# Patient Record
Sex: Female | Born: 1945 | Race: White | Hispanic: No
Health system: Southern US, Community
[De-identification: ages and names within clinical notes are randomized; demographics above are authoritative.]

---

## 2009-01-21 ENCOUNTER — Encounter: Admission: RE | Admit: 2009-01-21 | Discharge: 2009-01-21 | Payer: Self-pay | Admitting: Unknown Physician Specialty

## 2009-06-24 ENCOUNTER — Encounter: Admission: RE | Admit: 2009-06-24 | Discharge: 2009-06-24 | Payer: Self-pay | Admitting: Unknown Physician Specialty

## 2009-07-24 ENCOUNTER — Encounter: Admission: RE | Admit: 2009-07-24 | Discharge: 2009-07-24 | Payer: Self-pay | Admitting: Unknown Physician Specialty

## 2010-09-08 ENCOUNTER — Encounter: Payer: Self-pay | Admitting: Unknown Physician Specialty

## 2010-11-18 ENCOUNTER — Ambulatory Visit
Admission: RE | Admit: 2010-11-18 | Discharge: 2010-11-18 | Disposition: A | Discharge status: Home / Self Care | Payer: Self-pay | Source: Ambulatory Visit | Attending: Unknown Physician Specialty | Admitting: Unknown Physician Specialty

## 2010-11-18 ENCOUNTER — Other Ambulatory Visit: Payer: Self-pay | Admitting: Unknown Physician Specialty

## 2010-11-18 DIAGNOSIS — M25562 Pain in left knee: Secondary | ICD-10-CM

## 2010-12-29 ENCOUNTER — Ambulatory Visit
Admission: RE | Admit: 2010-12-29 | Discharge: 2010-12-29 | Disposition: A | Discharge status: Home / Self Care | Payer: BC Managed Care – PPO | Source: Ambulatory Visit | Attending: Unknown Physician Specialty | Admitting: Unknown Physician Specialty

## 2010-12-29 ENCOUNTER — Other Ambulatory Visit: Payer: Self-pay | Admitting: Unknown Physician Specialty

## 2010-12-29 DIAGNOSIS — IMO0001 Reserved for inherently not codable concepts without codable children: Secondary | ICD-10-CM

## 2010-12-29 DIAGNOSIS — R059 Cough, unspecified: Secondary | ICD-10-CM

## 2010-12-29 DIAGNOSIS — R05 Cough: Secondary | ICD-10-CM

## 2011-07-01 IMAGING — CR DG CHEST 2V
2 series · 2 of 2 positions shown · non-contrast
Comparison: None.

CLINICAL DATA: Cough and congestion.  Question pneumonia.

CHEST - 2 VIEW

[view not recorded (1 of 2)]
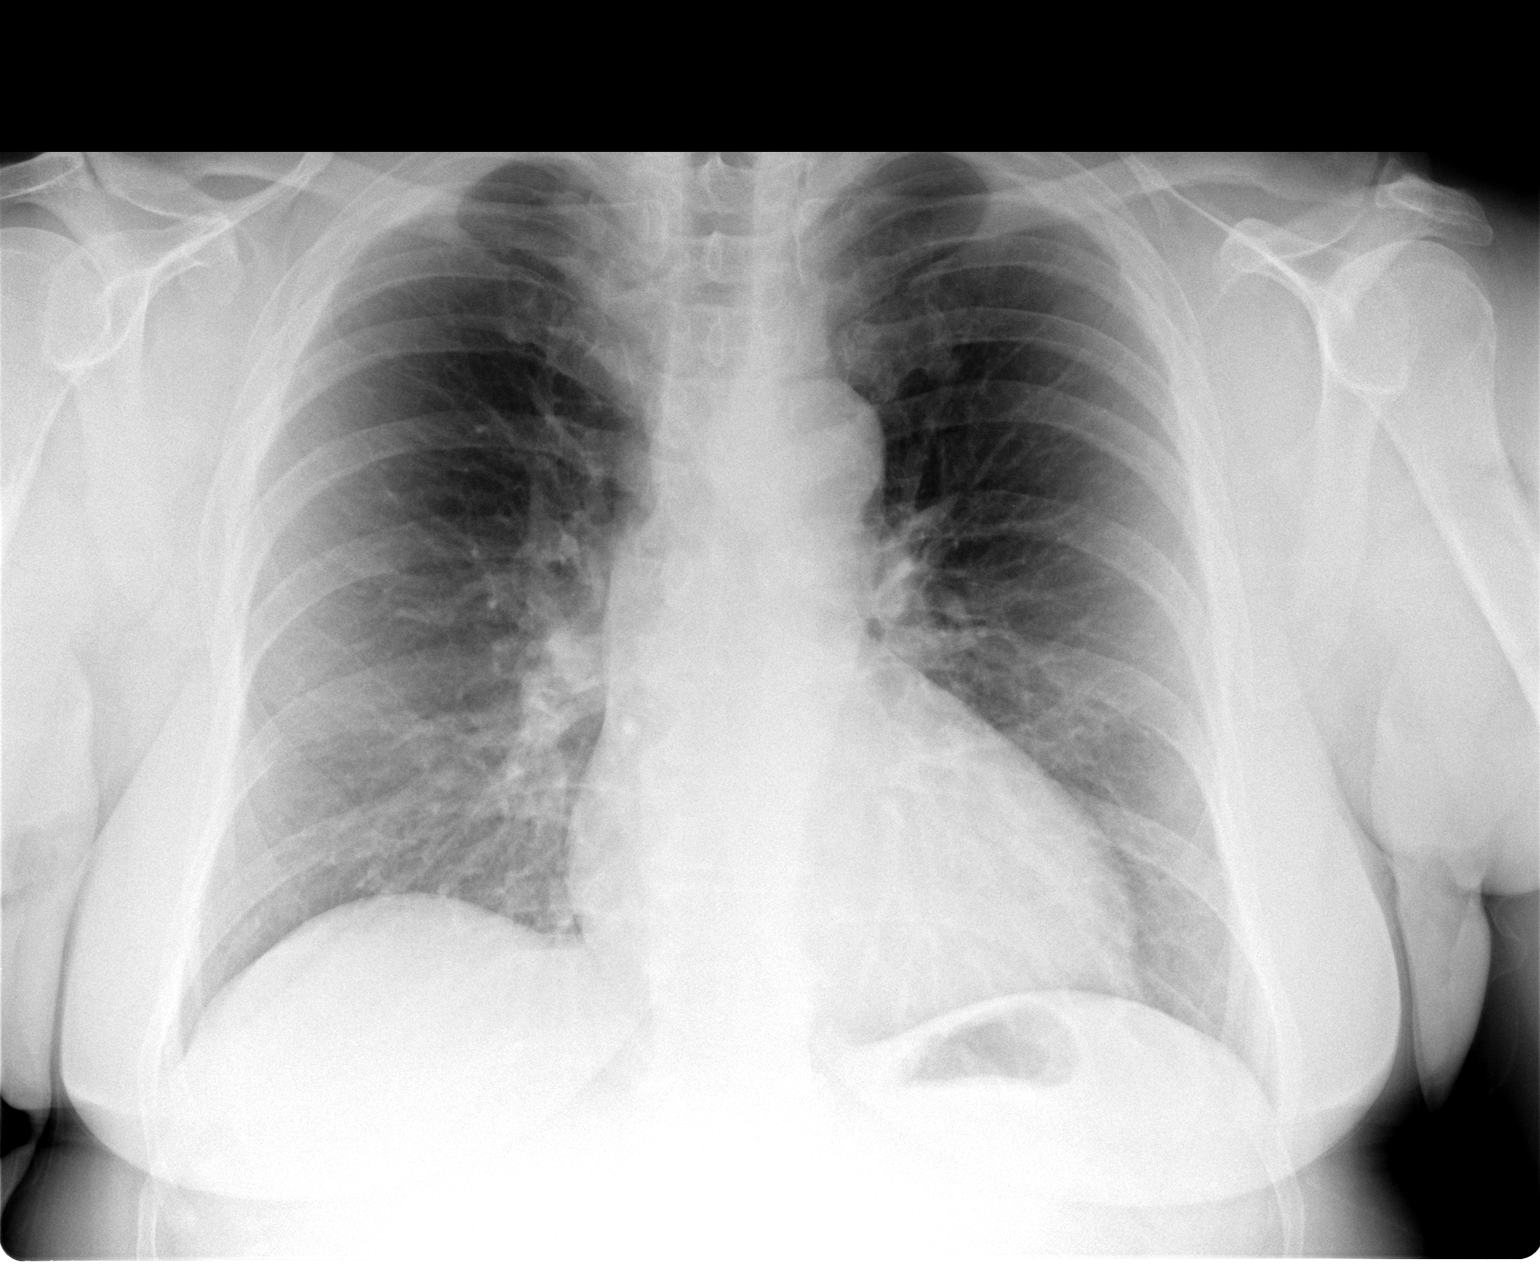

[view not recorded (2 of 2)]
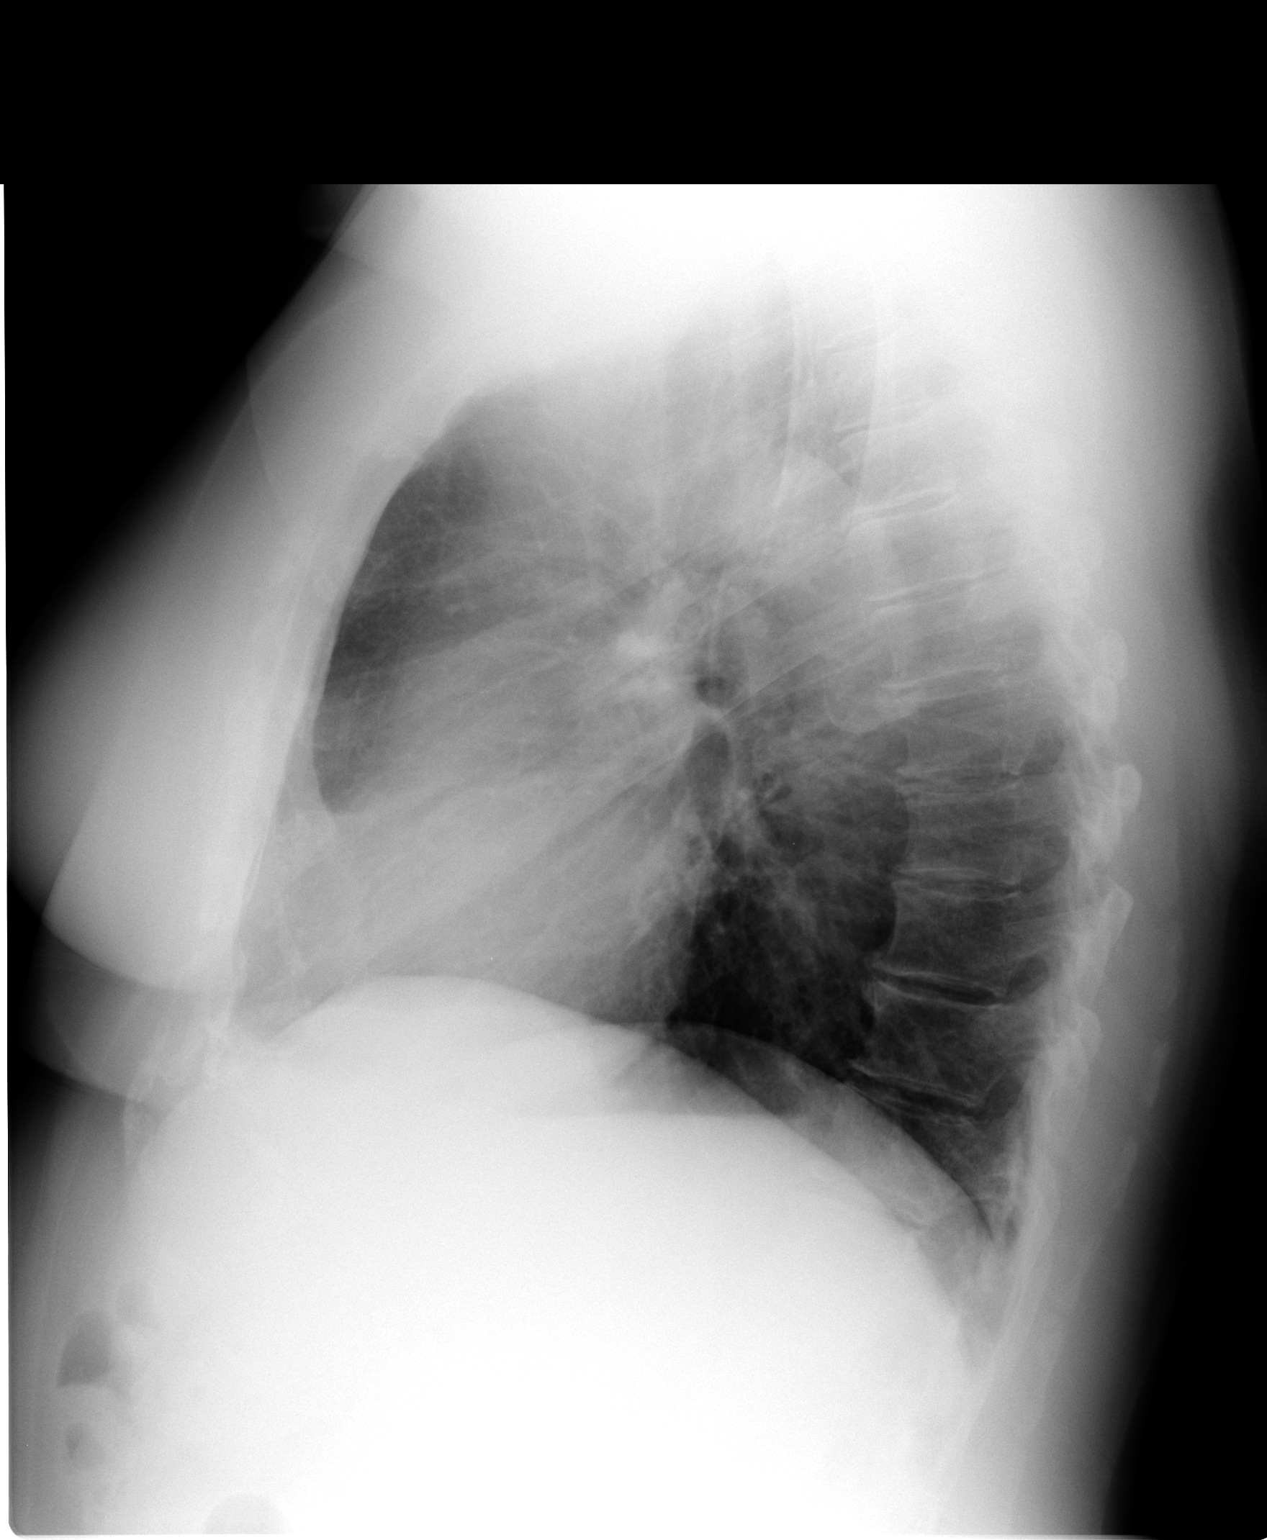

[2 of 2 positions shown; findings below may reference images not displayed]

FINDINGS: The heart, mediastinal, and hilar contours are normal.
Pulmonary vascularity is normal.  The lungs are clear.  No focal
airspace opacities identified to suggest pneumonia.  There is no
edema, effusion, or pneumothorax.  No acute bony abnormality.
IMPRESSION: No acute cardiopulmonary disease.

## 2011-10-13 ENCOUNTER — Ambulatory Visit
Admission: RE | Admit: 2011-10-13 | Discharge: 2011-10-13 | Disposition: A | Discharge status: Home / Self Care | Payer: BC Managed Care – PPO | Source: Ambulatory Visit | Attending: Unknown Physician Specialty | Admitting: Unknown Physician Specialty

## 2011-10-13 ENCOUNTER — Other Ambulatory Visit: Payer: Self-pay | Admitting: Unknown Physician Specialty

## 2014-01-15 ENCOUNTER — Other Ambulatory Visit: Payer: Self-pay | Admitting: Family Medicine

## 2014-01-15 ENCOUNTER — Ambulatory Visit (INDEPENDENT_AMBULATORY_CARE_PROVIDER_SITE_OTHER): Payer: BC Managed Care – PPO

## 2014-01-15 DIAGNOSIS — R059 Cough, unspecified: Secondary | ICD-10-CM

## 2014-01-15 DIAGNOSIS — R05 Cough: Secondary | ICD-10-CM

## 2014-01-15 DIAGNOSIS — J189 Pneumonia, unspecified organism: Secondary | ICD-10-CM

## 2014-01-15 DIAGNOSIS — R0989 Other specified symptoms and signs involving the circulatory and respiratory systems: Secondary | ICD-10-CM

## 2015-01-21 DIAGNOSIS — M545 Low back pain: Secondary | ICD-10-CM | POA: Diagnosis not present

## 2015-01-22 DIAGNOSIS — M47816 Spondylosis without myelopathy or radiculopathy, lumbar region: Secondary | ICD-10-CM | POA: Diagnosis not present

## 2015-01-22 DIAGNOSIS — M47817 Spondylosis without myelopathy or radiculopathy, lumbosacral region: Secondary | ICD-10-CM | POA: Diagnosis not present

## 2015-01-23 DIAGNOSIS — M545 Low back pain: Secondary | ICD-10-CM | POA: Diagnosis not present

## 2015-01-23 DIAGNOSIS — M4806 Spinal stenosis, lumbar region: Secondary | ICD-10-CM | POA: Diagnosis not present

## 2015-01-31 DIAGNOSIS — J3081 Allergic rhinitis due to animal (cat) (dog) hair and dander: Secondary | ICD-10-CM | POA: Diagnosis not present

## 2015-01-31 DIAGNOSIS — J3089 Other allergic rhinitis: Secondary | ICD-10-CM | POA: Diagnosis not present

## 2015-01-31 DIAGNOSIS — J301 Allergic rhinitis due to pollen: Secondary | ICD-10-CM | POA: Diagnosis not present

## 2015-02-20 DIAGNOSIS — J3089 Other allergic rhinitis: Secondary | ICD-10-CM | POA: Diagnosis not present

## 2015-02-20 DIAGNOSIS — J301 Allergic rhinitis due to pollen: Secondary | ICD-10-CM | POA: Diagnosis not present

## 2015-02-20 DIAGNOSIS — J3081 Allergic rhinitis due to animal (cat) (dog) hair and dander: Secondary | ICD-10-CM | POA: Diagnosis not present

## 2015-03-13 DIAGNOSIS — M1990 Unspecified osteoarthritis, unspecified site: Secondary | ICD-10-CM | POA: Diagnosis not present

## 2015-03-13 DIAGNOSIS — R739 Hyperglycemia, unspecified: Secondary | ICD-10-CM | POA: Diagnosis not present

## 2015-03-13 DIAGNOSIS — E782 Mixed hyperlipidemia: Secondary | ICD-10-CM | POA: Diagnosis not present

## 2015-03-13 DIAGNOSIS — I10 Essential (primary) hypertension: Secondary | ICD-10-CM | POA: Diagnosis not present

## 2015-03-14 DIAGNOSIS — J3081 Allergic rhinitis due to animal (cat) (dog) hair and dander: Secondary | ICD-10-CM | POA: Diagnosis not present

## 2015-03-14 DIAGNOSIS — Z91010 Allergy to peanuts: Secondary | ICD-10-CM | POA: Diagnosis not present

## 2015-03-14 DIAGNOSIS — J301 Allergic rhinitis due to pollen: Secondary | ICD-10-CM | POA: Diagnosis not present

## 2015-03-14 DIAGNOSIS — J3089 Other allergic rhinitis: Secondary | ICD-10-CM | POA: Diagnosis not present

## 2015-03-14 DIAGNOSIS — J4520 Mild intermittent asthma, uncomplicated: Secondary | ICD-10-CM | POA: Diagnosis not present

## 2015-04-08 DIAGNOSIS — N951 Menopausal and female climacteric states: Secondary | ICD-10-CM | POA: Diagnosis not present

## 2015-04-11 DIAGNOSIS — J3089 Other allergic rhinitis: Secondary | ICD-10-CM | POA: Diagnosis not present

## 2015-04-11 DIAGNOSIS — J3081 Allergic rhinitis due to animal (cat) (dog) hair and dander: Secondary | ICD-10-CM | POA: Diagnosis not present

## 2015-04-11 DIAGNOSIS — J301 Allergic rhinitis due to pollen: Secondary | ICD-10-CM | POA: Diagnosis not present

## 2015-04-30 DIAGNOSIS — H9202 Otalgia, left ear: Secondary | ICD-10-CM | POA: Diagnosis not present

## 2015-04-30 DIAGNOSIS — M542 Cervicalgia: Secondary | ICD-10-CM | POA: Diagnosis not present

## 2015-04-30 DIAGNOSIS — R200 Anesthesia of skin: Secondary | ICD-10-CM | POA: Diagnosis not present

## 2015-04-30 DIAGNOSIS — I10 Essential (primary) hypertension: Secondary | ICD-10-CM | POA: Diagnosis not present

## 2015-05-07 DIAGNOSIS — J3089 Other allergic rhinitis: Secondary | ICD-10-CM | POA: Diagnosis not present

## 2015-05-07 DIAGNOSIS — J301 Allergic rhinitis due to pollen: Secondary | ICD-10-CM | POA: Diagnosis not present

## 2015-05-07 DIAGNOSIS — J3081 Allergic rhinitis due to animal (cat) (dog) hair and dander: Secondary | ICD-10-CM | POA: Diagnosis not present

## 2015-06-05 DIAGNOSIS — J3089 Other allergic rhinitis: Secondary | ICD-10-CM | POA: Diagnosis not present

## 2015-06-05 DIAGNOSIS — J3081 Allergic rhinitis due to animal (cat) (dog) hair and dander: Secondary | ICD-10-CM | POA: Diagnosis not present

## 2015-06-05 DIAGNOSIS — J301 Allergic rhinitis due to pollen: Secondary | ICD-10-CM | POA: Diagnosis not present

## 2015-06-10 DIAGNOSIS — J301 Allergic rhinitis due to pollen: Secondary | ICD-10-CM | POA: Diagnosis not present

## 2015-06-10 DIAGNOSIS — J3081 Allergic rhinitis due to animal (cat) (dog) hair and dander: Secondary | ICD-10-CM | POA: Diagnosis not present

## 2015-06-10 DIAGNOSIS — J3089 Other allergic rhinitis: Secondary | ICD-10-CM | POA: Diagnosis not present

## 2015-07-08 DIAGNOSIS — J3081 Allergic rhinitis due to animal (cat) (dog) hair and dander: Secondary | ICD-10-CM | POA: Diagnosis not present

## 2015-07-08 DIAGNOSIS — J3089 Other allergic rhinitis: Secondary | ICD-10-CM | POA: Diagnosis not present

## 2015-07-08 DIAGNOSIS — J301 Allergic rhinitis due to pollen: Secondary | ICD-10-CM | POA: Diagnosis not present

## 2015-07-22 DIAGNOSIS — J3081 Allergic rhinitis due to animal (cat) (dog) hair and dander: Secondary | ICD-10-CM | POA: Diagnosis not present

## 2015-07-22 DIAGNOSIS — J301 Allergic rhinitis due to pollen: Secondary | ICD-10-CM | POA: Diagnosis not present

## 2015-07-22 DIAGNOSIS — J3089 Other allergic rhinitis: Secondary | ICD-10-CM | POA: Diagnosis not present

## 2015-08-05 DIAGNOSIS — J301 Allergic rhinitis due to pollen: Secondary | ICD-10-CM | POA: Diagnosis not present

## 2015-08-05 DIAGNOSIS — J3081 Allergic rhinitis due to animal (cat) (dog) hair and dander: Secondary | ICD-10-CM | POA: Diagnosis not present

## 2015-08-05 DIAGNOSIS — J3089 Other allergic rhinitis: Secondary | ICD-10-CM | POA: Diagnosis not present

## 2015-08-29 DIAGNOSIS — J3089 Other allergic rhinitis: Secondary | ICD-10-CM | POA: Diagnosis not present

## 2015-08-29 DIAGNOSIS — J301 Allergic rhinitis due to pollen: Secondary | ICD-10-CM | POA: Diagnosis not present

## 2015-08-29 DIAGNOSIS — J3081 Allergic rhinitis due to animal (cat) (dog) hair and dander: Secondary | ICD-10-CM | POA: Diagnosis not present

## 2015-09-09 DIAGNOSIS — R0981 Nasal congestion: Secondary | ICD-10-CM | POA: Diagnosis not present

## 2015-09-09 DIAGNOSIS — J101 Influenza due to other identified influenza virus with other respiratory manifestations: Secondary | ICD-10-CM | POA: Diagnosis not present

## 2015-09-09 DIAGNOSIS — J9801 Acute bronchospasm: Secondary | ICD-10-CM | POA: Diagnosis not present

## 2015-09-09 DIAGNOSIS — H01149 Xeroderma of unspecified eye, unspecified eyelid: Secondary | ICD-10-CM | POA: Diagnosis not present

## 2015-09-09 DIAGNOSIS — R05 Cough: Secondary | ICD-10-CM | POA: Diagnosis not present

## 2015-09-30 DIAGNOSIS — H01111 Allergic dermatitis of right upper eyelid: Secondary | ICD-10-CM | POA: Diagnosis not present

## 2015-09-30 DIAGNOSIS — H01114 Allergic dermatitis of left upper eyelid: Secondary | ICD-10-CM | POA: Diagnosis not present

## 2015-09-30 DIAGNOSIS — H5202 Hypermetropia, left eye: Secondary | ICD-10-CM | POA: Diagnosis not present

## 2015-09-30 DIAGNOSIS — Z135 Encounter for screening for eye and ear disorders: Secondary | ICD-10-CM | POA: Diagnosis not present

## 2015-09-30 DIAGNOSIS — H2513 Age-related nuclear cataract, bilateral: Secondary | ICD-10-CM | POA: Diagnosis not present

## 2015-10-01 DIAGNOSIS — J3081 Allergic rhinitis due to animal (cat) (dog) hair and dander: Secondary | ICD-10-CM | POA: Diagnosis not present

## 2015-10-01 DIAGNOSIS — J301 Allergic rhinitis due to pollen: Secondary | ICD-10-CM | POA: Diagnosis not present

## 2015-10-01 DIAGNOSIS — J3089 Other allergic rhinitis: Secondary | ICD-10-CM | POA: Diagnosis not present

## 2015-10-08 DIAGNOSIS — R7302 Impaired glucose tolerance (oral): Secondary | ICD-10-CM | POA: Diagnosis not present

## 2015-10-08 DIAGNOSIS — M47812 Spondylosis without myelopathy or radiculopathy, cervical region: Secondary | ICD-10-CM | POA: Diagnosis not present

## 2015-10-08 DIAGNOSIS — R739 Hyperglycemia, unspecified: Secondary | ICD-10-CM | POA: Diagnosis not present

## 2015-10-08 DIAGNOSIS — Z0001 Encounter for general adult medical examination with abnormal findings: Secondary | ICD-10-CM | POA: Diagnosis not present

## 2015-10-08 DIAGNOSIS — I10 Essential (primary) hypertension: Secondary | ICD-10-CM | POA: Diagnosis not present

## 2015-10-08 DIAGNOSIS — B359 Dermatophytosis, unspecified: Secondary | ICD-10-CM | POA: Diagnosis not present

## 2015-10-08 DIAGNOSIS — Z23 Encounter for immunization: Secondary | ICD-10-CM | POA: Diagnosis not present

## 2015-10-08 DIAGNOSIS — M50322 Other cervical disc degeneration at C5-C6 level: Secondary | ICD-10-CM | POA: Diagnosis not present

## 2015-10-08 DIAGNOSIS — M4692 Unspecified inflammatory spondylopathy, cervical region: Secondary | ICD-10-CM | POA: Diagnosis not present

## 2015-10-08 DIAGNOSIS — R5383 Other fatigue: Secondary | ICD-10-CM | POA: Diagnosis not present

## 2015-10-08 DIAGNOSIS — R0683 Snoring: Secondary | ICD-10-CM | POA: Diagnosis not present

## 2015-10-08 DIAGNOSIS — R8290 Unspecified abnormal findings in urine: Secondary | ICD-10-CM | POA: Diagnosis not present

## 2015-10-08 DIAGNOSIS — Z8349 Family history of other endocrine, nutritional and metabolic diseases: Secondary | ICD-10-CM | POA: Diagnosis not present

## 2015-10-08 DIAGNOSIS — M5030 Other cervical disc degeneration, unspecified cervical region: Secondary | ICD-10-CM | POA: Diagnosis not present

## 2015-10-08 DIAGNOSIS — M542 Cervicalgia: Secondary | ICD-10-CM | POA: Diagnosis not present

## 2015-10-08 DIAGNOSIS — K219 Gastro-esophageal reflux disease without esophagitis: Secondary | ICD-10-CM | POA: Diagnosis not present

## 2015-10-08 DIAGNOSIS — E039 Hypothyroidism, unspecified: Secondary | ICD-10-CM | POA: Diagnosis not present

## 2015-10-21 DIAGNOSIS — M542 Cervicalgia: Secondary | ICD-10-CM | POA: Diagnosis not present

## 2015-10-21 DIAGNOSIS — M5412 Radiculopathy, cervical region: Secondary | ICD-10-CM | POA: Diagnosis not present

## 2015-10-28 ENCOUNTER — Ambulatory Visit (INDEPENDENT_AMBULATORY_CARE_PROVIDER_SITE_OTHER): Payer: Medicare Other

## 2015-10-28 ENCOUNTER — Other Ambulatory Visit: Payer: Self-pay | Admitting: Family Medicine

## 2015-10-28 DIAGNOSIS — R062 Wheezing: Secondary | ICD-10-CM | POA: Diagnosis not present

## 2015-10-28 DIAGNOSIS — R05 Cough: Secondary | ICD-10-CM

## 2015-10-28 DIAGNOSIS — J9801 Acute bronchospasm: Secondary | ICD-10-CM | POA: Diagnosis not present

## 2015-10-28 DIAGNOSIS — R0981 Nasal congestion: Secondary | ICD-10-CM | POA: Diagnosis not present

## 2015-10-28 DIAGNOSIS — R059 Cough, unspecified: Secondary | ICD-10-CM

## 2015-10-28 DIAGNOSIS — J069 Acute upper respiratory infection, unspecified: Secondary | ICD-10-CM | POA: Diagnosis not present

## 2015-10-28 DIAGNOSIS — R0789 Other chest pain: Secondary | ICD-10-CM | POA: Diagnosis not present

## 2015-10-30 DIAGNOSIS — M5412 Radiculopathy, cervical region: Secondary | ICD-10-CM | POA: Diagnosis not present

## 2015-10-30 DIAGNOSIS — M542 Cervicalgia: Secondary | ICD-10-CM | POA: Diagnosis not present

## 2015-10-31 DIAGNOSIS — J3081 Allergic rhinitis due to animal (cat) (dog) hair and dander: Secondary | ICD-10-CM | POA: Diagnosis not present

## 2015-10-31 DIAGNOSIS — J3089 Other allergic rhinitis: Secondary | ICD-10-CM | POA: Diagnosis not present

## 2015-10-31 DIAGNOSIS — J301 Allergic rhinitis due to pollen: Secondary | ICD-10-CM | POA: Diagnosis not present

## 2015-11-25 DIAGNOSIS — H0014 Chalazion left upper eyelid: Secondary | ICD-10-CM | POA: Diagnosis not present

## 2015-11-25 DIAGNOSIS — H2513 Age-related nuclear cataract, bilateral: Secondary | ICD-10-CM | POA: Diagnosis not present

## 2015-11-25 DIAGNOSIS — H01001 Unspecified blepharitis right upper eyelid: Secondary | ICD-10-CM | POA: Diagnosis not present

## 2015-11-25 DIAGNOSIS — H01004 Unspecified blepharitis left upper eyelid: Secondary | ICD-10-CM | POA: Diagnosis not present

## 2015-11-25 DIAGNOSIS — G4710 Hypersomnia, unspecified: Secondary | ICD-10-CM | POA: Diagnosis not present

## 2015-11-27 DIAGNOSIS — J3089 Other allergic rhinitis: Secondary | ICD-10-CM | POA: Diagnosis not present

## 2015-11-27 DIAGNOSIS — J3081 Allergic rhinitis due to animal (cat) (dog) hair and dander: Secondary | ICD-10-CM | POA: Diagnosis not present

## 2015-11-27 DIAGNOSIS — J301 Allergic rhinitis due to pollen: Secondary | ICD-10-CM | POA: Diagnosis not present

## 2015-11-28 DIAGNOSIS — Z1231 Encounter for screening mammogram for malignant neoplasm of breast: Secondary | ICD-10-CM | POA: Diagnosis not present

## 2015-11-28 DIAGNOSIS — Z13820 Encounter for screening for osteoporosis: Secondary | ICD-10-CM | POA: Diagnosis not present

## 2015-11-28 DIAGNOSIS — Z780 Asymptomatic menopausal state: Secondary | ICD-10-CM | POA: Diagnosis not present

## 2015-11-29 DIAGNOSIS — H0014 Chalazion left upper eyelid: Secondary | ICD-10-CM | POA: Diagnosis not present

## 2015-12-02 DIAGNOSIS — G4733 Obstructive sleep apnea (adult) (pediatric): Secondary | ICD-10-CM | POA: Diagnosis not present

## 2015-12-09 DIAGNOSIS — R5383 Other fatigue: Secondary | ICD-10-CM | POA: Diagnosis not present

## 2015-12-09 DIAGNOSIS — R0602 Shortness of breath: Secondary | ICD-10-CM | POA: Diagnosis not present

## 2015-12-09 DIAGNOSIS — G4733 Obstructive sleep apnea (adult) (pediatric): Secondary | ICD-10-CM | POA: Diagnosis not present

## 2015-12-25 DIAGNOSIS — J3089 Other allergic rhinitis: Secondary | ICD-10-CM | POA: Diagnosis not present

## 2015-12-25 DIAGNOSIS — J301 Allergic rhinitis due to pollen: Secondary | ICD-10-CM | POA: Diagnosis not present

## 2015-12-25 DIAGNOSIS — J3081 Allergic rhinitis due to animal (cat) (dog) hair and dander: Secondary | ICD-10-CM | POA: Diagnosis not present

## 2015-12-26 DIAGNOSIS — G4733 Obstructive sleep apnea (adult) (pediatric): Secondary | ICD-10-CM | POA: Diagnosis not present

## 2016-01-15 DIAGNOSIS — Z91010 Allergy to peanuts: Secondary | ICD-10-CM | POA: Diagnosis not present

## 2016-01-15 DIAGNOSIS — J4520 Mild intermittent asthma, uncomplicated: Secondary | ICD-10-CM | POA: Diagnosis not present

## 2016-01-15 DIAGNOSIS — J3081 Allergic rhinitis due to animal (cat) (dog) hair and dander: Secondary | ICD-10-CM | POA: Diagnosis not present

## 2016-01-15 DIAGNOSIS — J3089 Other allergic rhinitis: Secondary | ICD-10-CM | POA: Diagnosis not present

## 2016-01-15 DIAGNOSIS — J301 Allergic rhinitis due to pollen: Secondary | ICD-10-CM | POA: Diagnosis not present

## 2016-01-26 DIAGNOSIS — G4733 Obstructive sleep apnea (adult) (pediatric): Secondary | ICD-10-CM | POA: Diagnosis not present

## 2016-02-06 DIAGNOSIS — J301 Allergic rhinitis due to pollen: Secondary | ICD-10-CM | POA: Diagnosis not present

## 2016-02-06 DIAGNOSIS — J3089 Other allergic rhinitis: Secondary | ICD-10-CM | POA: Diagnosis not present

## 2016-02-06 DIAGNOSIS — J3081 Allergic rhinitis due to animal (cat) (dog) hair and dander: Secondary | ICD-10-CM | POA: Diagnosis not present

## 2016-02-08 DIAGNOSIS — G4733 Obstructive sleep apnea (adult) (pediatric): Secondary | ICD-10-CM | POA: Diagnosis not present

## 2016-02-10 DIAGNOSIS — R5383 Other fatigue: Secondary | ICD-10-CM | POA: Diagnosis not present

## 2016-02-10 DIAGNOSIS — G4733 Obstructive sleep apnea (adult) (pediatric): Secondary | ICD-10-CM | POA: Diagnosis not present

## 2016-02-10 DIAGNOSIS — Z9989 Dependence on other enabling machines and devices: Secondary | ICD-10-CM | POA: Diagnosis not present

## 2016-02-20 DIAGNOSIS — J069 Acute upper respiratory infection, unspecified: Secondary | ICD-10-CM | POA: Diagnosis not present

## 2016-02-20 DIAGNOSIS — J9801 Acute bronchospasm: Secondary | ICD-10-CM | POA: Diagnosis not present

## 2016-02-25 DIAGNOSIS — G4733 Obstructive sleep apnea (adult) (pediatric): Secondary | ICD-10-CM | POA: Diagnosis not present

## 2016-03-10 DIAGNOSIS — J3081 Allergic rhinitis due to animal (cat) (dog) hair and dander: Secondary | ICD-10-CM | POA: Diagnosis not present

## 2016-03-10 DIAGNOSIS — J301 Allergic rhinitis due to pollen: Secondary | ICD-10-CM | POA: Diagnosis not present

## 2016-03-10 DIAGNOSIS — J3089 Other allergic rhinitis: Secondary | ICD-10-CM | POA: Diagnosis not present

## 2016-04-01 DIAGNOSIS — J3081 Allergic rhinitis due to animal (cat) (dog) hair and dander: Secondary | ICD-10-CM | POA: Diagnosis not present

## 2016-04-01 DIAGNOSIS — J301 Allergic rhinitis due to pollen: Secondary | ICD-10-CM | POA: Diagnosis not present

## 2016-04-01 DIAGNOSIS — J3089 Other allergic rhinitis: Secondary | ICD-10-CM | POA: Diagnosis not present

## 2016-04-13 DIAGNOSIS — Z01419 Encounter for gynecological examination (general) (routine) without abnormal findings: Secondary | ICD-10-CM | POA: Diagnosis not present

## 2016-04-13 DIAGNOSIS — I10 Essential (primary) hypertension: Secondary | ICD-10-CM | POA: Diagnosis not present

## 2016-04-13 DIAGNOSIS — E785 Hyperlipidemia, unspecified: Secondary | ICD-10-CM | POA: Diagnosis not present

## 2016-04-13 DIAGNOSIS — R739 Hyperglycemia, unspecified: Secondary | ICD-10-CM | POA: Diagnosis not present

## 2016-04-13 DIAGNOSIS — M150 Primary generalized (osteo)arthritis: Secondary | ICD-10-CM | POA: Diagnosis not present

## 2016-04-16 DIAGNOSIS — R7309 Other abnormal glucose: Secondary | ICD-10-CM | POA: Diagnosis not present

## 2016-04-27 DIAGNOSIS — G4733 Obstructive sleep apnea (adult) (pediatric): Secondary | ICD-10-CM | POA: Diagnosis not present

## 2016-04-27 DIAGNOSIS — R05 Cough: Secondary | ICD-10-CM | POA: Diagnosis not present

## 2016-04-27 DIAGNOSIS — J45909 Unspecified asthma, uncomplicated: Secondary | ICD-10-CM | POA: Diagnosis not present

## 2016-04-29 DIAGNOSIS — J3089 Other allergic rhinitis: Secondary | ICD-10-CM | POA: Diagnosis not present

## 2016-04-29 DIAGNOSIS — J301 Allergic rhinitis due to pollen: Secondary | ICD-10-CM | POA: Diagnosis not present

## 2016-04-29 DIAGNOSIS — J3081 Allergic rhinitis due to animal (cat) (dog) hair and dander: Secondary | ICD-10-CM | POA: Diagnosis not present

## 2016-04-29 IMAGING — CR DG CHEST 2V
2 series · 2 of 2 positions shown · non-contrast
Comparison: 01/15/2014, 12/29/2010.

CLINICAL DATA: 69-year-old with greater than 1 week history of
cough for which the patient is currently undergoing antibiotic
therapy. Current history of hypertension.

EXAM:
CHEST  2 VIEW

[chest pa]
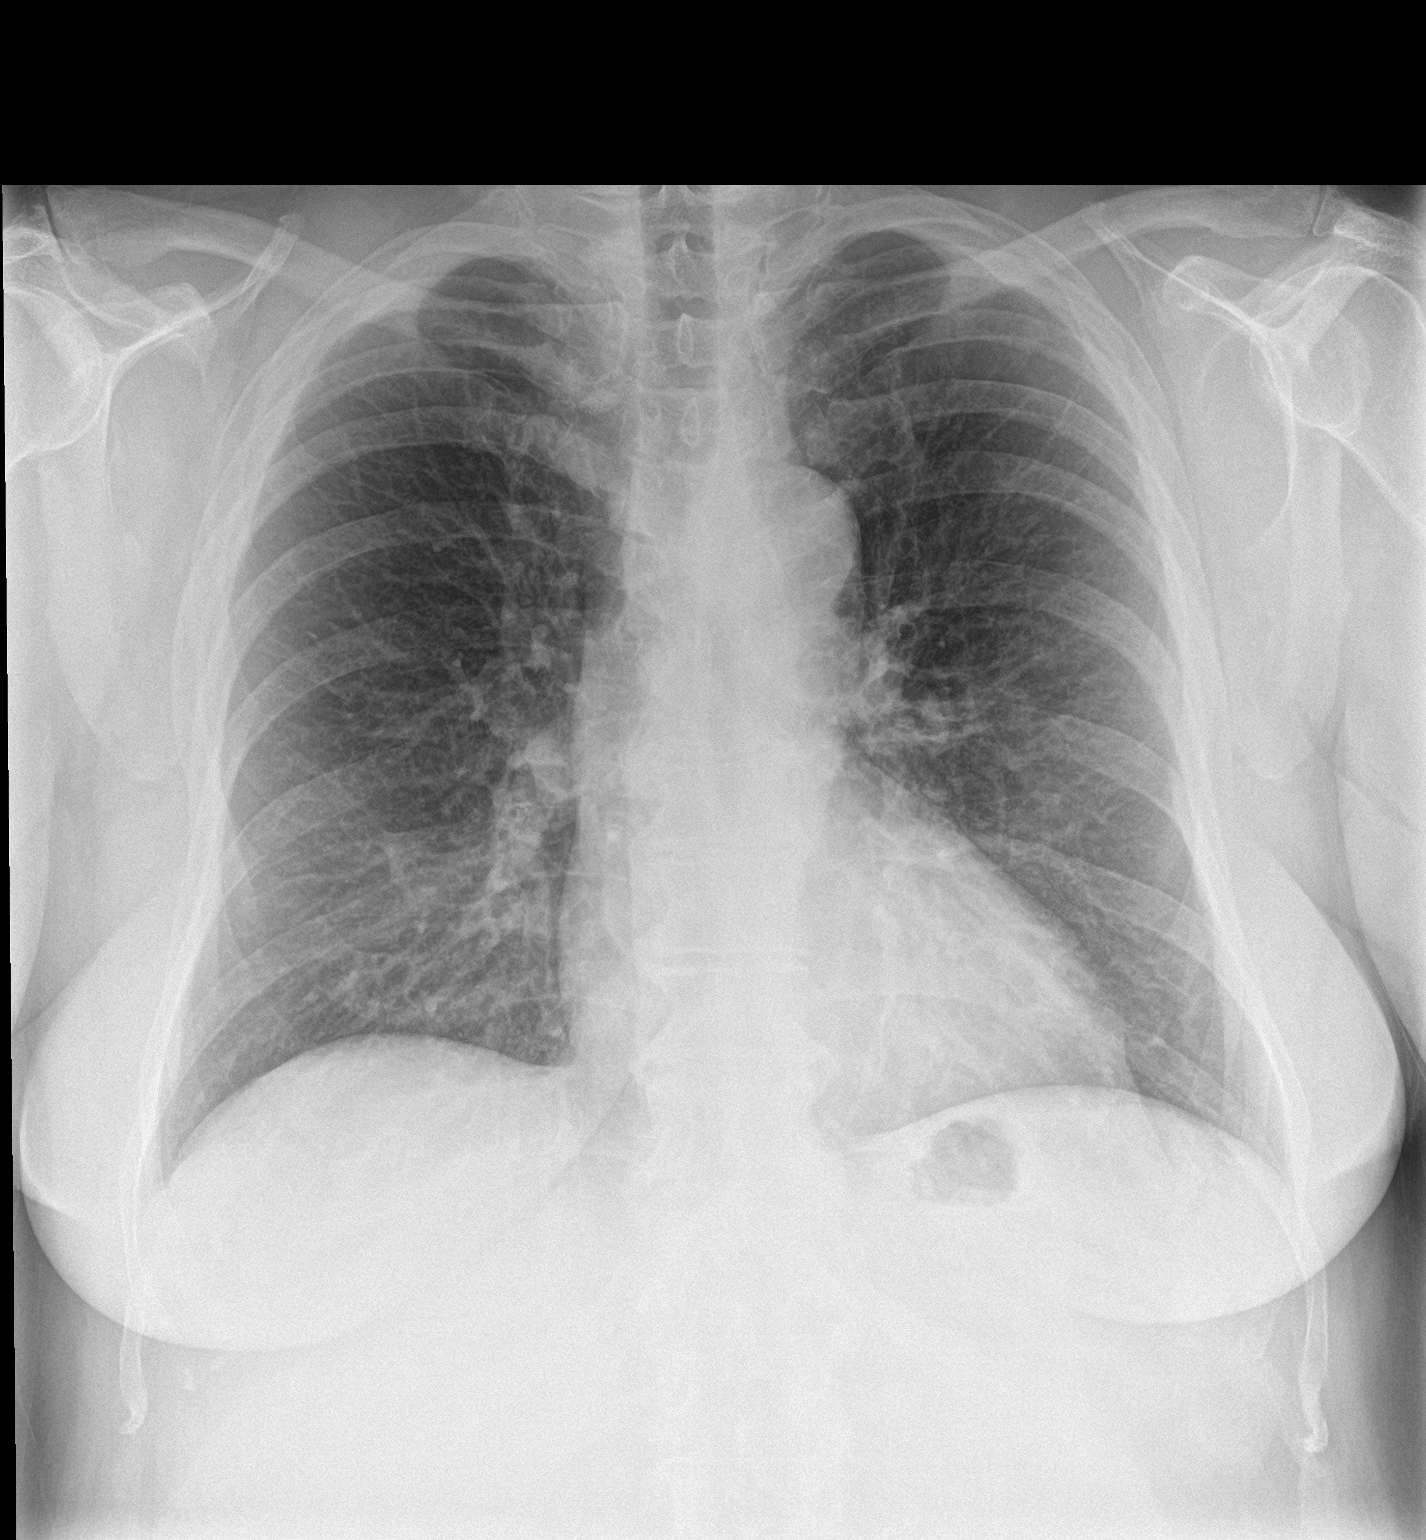

[chest lat]
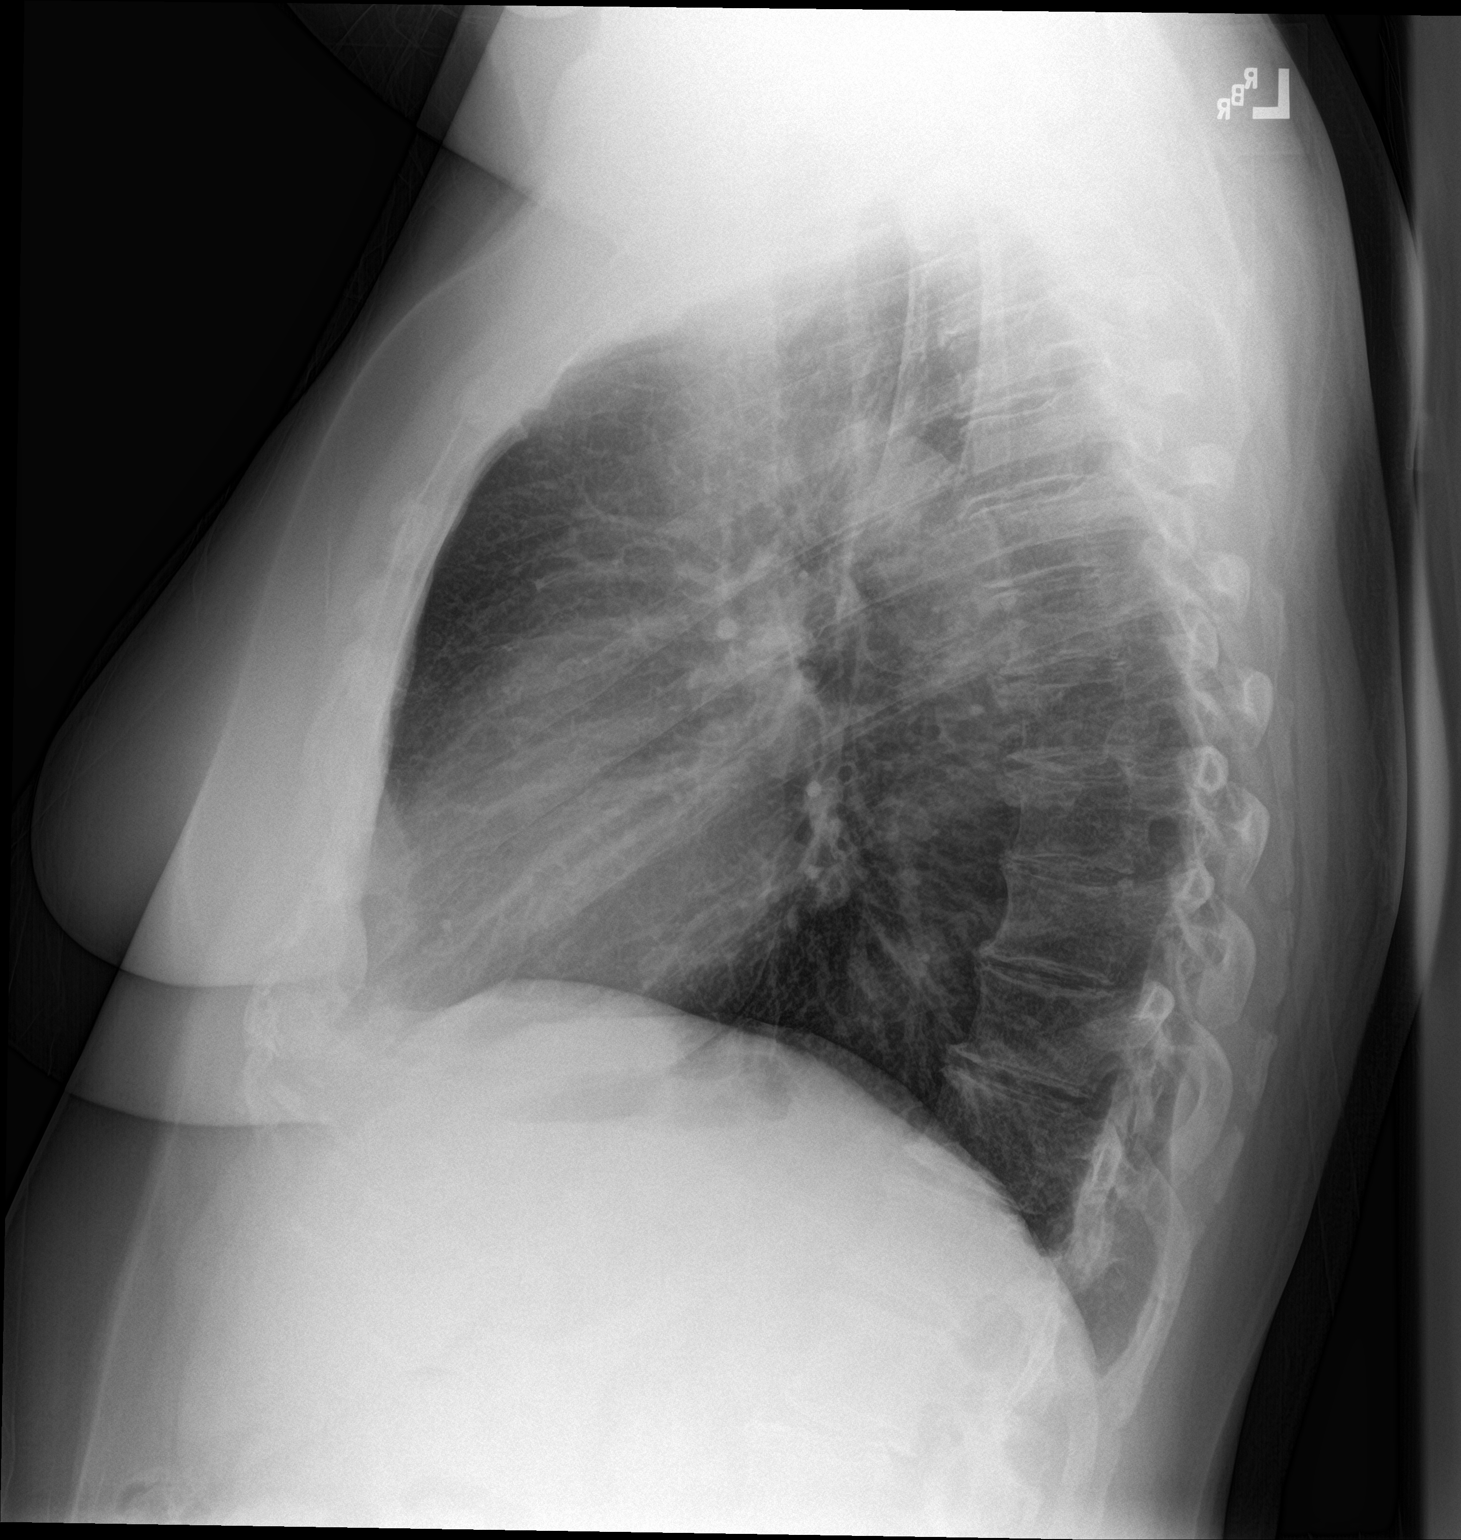

[2 of 2 positions shown; findings below may reference images not displayed]

FINDINGS: Cardiac silhouette normal in size, unchanged. Thoracic aorta
minimally tortuous and atherosclerotic, unchanged. Hilar and
mediastinal contours otherwise unremarkable. Prominent
bronchovascular markings diffusely and moderate central
peribronchial thickening, more so than on the prior examinations.
Lungs otherwise clear. No localized airspace consolidation. No
pleural effusions. No pneumothorax. Normal pulmonary vascularity.
Mild degenerative changes involving the thoracic spine.
IMPRESSION: Moderate changes of acute bronchitis and/or asthma without focal
airspace pneumonia.

## 2016-05-18 DIAGNOSIS — R05 Cough: Secondary | ICD-10-CM | POA: Diagnosis not present

## 2016-05-18 DIAGNOSIS — J479 Bronchiectasis, uncomplicated: Secondary | ICD-10-CM | POA: Diagnosis not present

## 2016-05-18 DIAGNOSIS — K760 Fatty (change of) liver, not elsewhere classified: Secondary | ICD-10-CM | POA: Diagnosis not present

## 2016-05-18 DIAGNOSIS — G4733 Obstructive sleep apnea (adult) (pediatric): Secondary | ICD-10-CM | POA: Diagnosis not present

## 2016-05-18 DIAGNOSIS — I2510 Atherosclerotic heart disease of native coronary artery without angina pectoris: Secondary | ICD-10-CM | POA: Diagnosis not present

## 2016-05-18 DIAGNOSIS — R5383 Other fatigue: Secondary | ICD-10-CM | POA: Diagnosis not present

## 2016-05-18 DIAGNOSIS — R0602 Shortness of breath: Secondary | ICD-10-CM | POA: Diagnosis not present

## 2016-05-18 DIAGNOSIS — J449 Chronic obstructive pulmonary disease, unspecified: Secondary | ICD-10-CM | POA: Diagnosis not present

## 2016-05-20 DIAGNOSIS — J3089 Other allergic rhinitis: Secondary | ICD-10-CM | POA: Diagnosis not present

## 2016-05-20 DIAGNOSIS — J301 Allergic rhinitis due to pollen: Secondary | ICD-10-CM | POA: Diagnosis not present

## 2016-05-20 DIAGNOSIS — J3081 Allergic rhinitis due to animal (cat) (dog) hair and dander: Secondary | ICD-10-CM | POA: Diagnosis not present

## 2016-05-25 DIAGNOSIS — J301 Allergic rhinitis due to pollen: Secondary | ICD-10-CM | POA: Diagnosis not present

## 2016-05-25 DIAGNOSIS — J3081 Allergic rhinitis due to animal (cat) (dog) hair and dander: Secondary | ICD-10-CM | POA: Diagnosis not present

## 2016-05-25 DIAGNOSIS — J3089 Other allergic rhinitis: Secondary | ICD-10-CM | POA: Diagnosis not present

## 2016-05-27 DIAGNOSIS — G4733 Obstructive sleep apnea (adult) (pediatric): Secondary | ICD-10-CM | POA: Diagnosis not present

## 2016-06-15 DIAGNOSIS — J3089 Other allergic rhinitis: Secondary | ICD-10-CM | POA: Diagnosis not present

## 2016-06-15 DIAGNOSIS — J301 Allergic rhinitis due to pollen: Secondary | ICD-10-CM | POA: Diagnosis not present

## 2016-06-15 DIAGNOSIS — J3081 Allergic rhinitis due to animal (cat) (dog) hair and dander: Secondary | ICD-10-CM | POA: Diagnosis not present

## 2016-06-27 DIAGNOSIS — G4733 Obstructive sleep apnea (adult) (pediatric): Secondary | ICD-10-CM | POA: Diagnosis not present

## 2016-07-08 DIAGNOSIS — G4733 Obstructive sleep apnea (adult) (pediatric): Secondary | ICD-10-CM | POA: Diagnosis not present

## 2016-07-13 DIAGNOSIS — J301 Allergic rhinitis due to pollen: Secondary | ICD-10-CM | POA: Diagnosis not present

## 2016-07-13 DIAGNOSIS — J3081 Allergic rhinitis due to animal (cat) (dog) hair and dander: Secondary | ICD-10-CM | POA: Diagnosis not present

## 2016-07-13 DIAGNOSIS — J3089 Other allergic rhinitis: Secondary | ICD-10-CM | POA: Diagnosis not present

## 2016-07-27 DIAGNOSIS — G4733 Obstructive sleep apnea (adult) (pediatric): Secondary | ICD-10-CM | POA: Diagnosis not present

## 2016-08-05 DIAGNOSIS — J3081 Allergic rhinitis due to animal (cat) (dog) hair and dander: Secondary | ICD-10-CM | POA: Diagnosis not present

## 2016-08-05 DIAGNOSIS — J3089 Other allergic rhinitis: Secondary | ICD-10-CM | POA: Diagnosis not present

## 2016-08-05 DIAGNOSIS — J301 Allergic rhinitis due to pollen: Secondary | ICD-10-CM | POA: Diagnosis not present

## 2016-08-27 DIAGNOSIS — G4733 Obstructive sleep apnea (adult) (pediatric): Secondary | ICD-10-CM | POA: Diagnosis not present

## 2016-08-31 DIAGNOSIS — J3081 Allergic rhinitis due to animal (cat) (dog) hair and dander: Secondary | ICD-10-CM | POA: Diagnosis not present

## 2016-08-31 DIAGNOSIS — J301 Allergic rhinitis due to pollen: Secondary | ICD-10-CM | POA: Diagnosis not present

## 2016-08-31 DIAGNOSIS — J3089 Other allergic rhinitis: Secondary | ICD-10-CM | POA: Diagnosis not present

## 2016-10-01 DIAGNOSIS — J3081 Allergic rhinitis due to animal (cat) (dog) hair and dander: Secondary | ICD-10-CM | POA: Diagnosis not present

## 2016-10-01 DIAGNOSIS — J3089 Other allergic rhinitis: Secondary | ICD-10-CM | POA: Diagnosis not present

## 2016-10-01 DIAGNOSIS — J301 Allergic rhinitis due to pollen: Secondary | ICD-10-CM | POA: Diagnosis not present

## 2016-10-09 DIAGNOSIS — G4733 Obstructive sleep apnea (adult) (pediatric): Secondary | ICD-10-CM | POA: Diagnosis not present

## 2016-10-16 DIAGNOSIS — Z0000 Encounter for general adult medical examination without abnormal findings: Secondary | ICD-10-CM | POA: Diagnosis not present

## 2016-10-16 DIAGNOSIS — J4520 Mild intermittent asthma, uncomplicated: Secondary | ICD-10-CM | POA: Diagnosis not present

## 2016-10-16 DIAGNOSIS — E785 Hyperlipidemia, unspecified: Secondary | ICD-10-CM | POA: Diagnosis not present

## 2016-10-16 DIAGNOSIS — R7989 Other specified abnormal findings of blood chemistry: Secondary | ICD-10-CM | POA: Diagnosis not present

## 2016-10-16 DIAGNOSIS — I10 Essential (primary) hypertension: Secondary | ICD-10-CM | POA: Diagnosis not present

## 2016-10-19 DIAGNOSIS — Z1211 Encounter for screening for malignant neoplasm of colon: Secondary | ICD-10-CM | POA: Diagnosis not present

## 2016-10-19 DIAGNOSIS — Z79899 Other long term (current) drug therapy: Secondary | ICD-10-CM | POA: Diagnosis not present

## 2016-10-19 DIAGNOSIS — G4733 Obstructive sleep apnea (adult) (pediatric): Secondary | ICD-10-CM | POA: Diagnosis not present

## 2016-10-27 DIAGNOSIS — J301 Allergic rhinitis due to pollen: Secondary | ICD-10-CM | POA: Diagnosis not present

## 2016-10-27 DIAGNOSIS — J3089 Other allergic rhinitis: Secondary | ICD-10-CM | POA: Diagnosis not present

## 2016-10-27 DIAGNOSIS — J3081 Allergic rhinitis due to animal (cat) (dog) hair and dander: Secondary | ICD-10-CM | POA: Diagnosis not present

## 2016-11-19 DIAGNOSIS — J3081 Allergic rhinitis due to animal (cat) (dog) hair and dander: Secondary | ICD-10-CM | POA: Diagnosis not present

## 2016-11-19 DIAGNOSIS — J3089 Other allergic rhinitis: Secondary | ICD-10-CM | POA: Diagnosis not present

## 2016-11-19 DIAGNOSIS — J301 Allergic rhinitis due to pollen: Secondary | ICD-10-CM | POA: Diagnosis not present

## 2016-11-30 DIAGNOSIS — J449 Chronic obstructive pulmonary disease, unspecified: Secondary | ICD-10-CM | POA: Diagnosis not present

## 2016-11-30 DIAGNOSIS — R05 Cough: Secondary | ICD-10-CM | POA: Diagnosis not present

## 2016-11-30 DIAGNOSIS — G4733 Obstructive sleep apnea (adult) (pediatric): Secondary | ICD-10-CM | POA: Diagnosis not present

## 2016-12-02 DIAGNOSIS — H52203 Unspecified astigmatism, bilateral: Secondary | ICD-10-CM | POA: Diagnosis not present

## 2016-12-02 DIAGNOSIS — Z135 Encounter for screening for eye and ear disorders: Secondary | ICD-10-CM | POA: Diagnosis not present

## 2016-12-02 DIAGNOSIS — H2513 Age-related nuclear cataract, bilateral: Secondary | ICD-10-CM | POA: Diagnosis not present

## 2016-12-14 DIAGNOSIS — H1045 Other chronic allergic conjunctivitis: Secondary | ICD-10-CM | POA: Diagnosis not present

## 2016-12-16 DIAGNOSIS — J301 Allergic rhinitis due to pollen: Secondary | ICD-10-CM | POA: Diagnosis not present

## 2016-12-16 DIAGNOSIS — J3081 Allergic rhinitis due to animal (cat) (dog) hair and dander: Secondary | ICD-10-CM | POA: Diagnosis not present

## 2016-12-16 DIAGNOSIS — J3089 Other allergic rhinitis: Secondary | ICD-10-CM | POA: Diagnosis not present

## 2017-01-12 DIAGNOSIS — J3081 Allergic rhinitis due to animal (cat) (dog) hair and dander: Secondary | ICD-10-CM | POA: Diagnosis not present

## 2017-01-12 DIAGNOSIS — J301 Allergic rhinitis due to pollen: Secondary | ICD-10-CM | POA: Diagnosis not present

## 2017-01-12 DIAGNOSIS — J3089 Other allergic rhinitis: Secondary | ICD-10-CM | POA: Diagnosis not present

## 2017-01-21 DIAGNOSIS — Z1231 Encounter for screening mammogram for malignant neoplasm of breast: Secondary | ICD-10-CM | POA: Diagnosis not present

## 2017-01-26 DIAGNOSIS — J3081 Allergic rhinitis due to animal (cat) (dog) hair and dander: Secondary | ICD-10-CM | POA: Diagnosis not present

## 2017-01-26 DIAGNOSIS — J301 Allergic rhinitis due to pollen: Secondary | ICD-10-CM | POA: Diagnosis not present

## 2017-01-26 DIAGNOSIS — J3089 Other allergic rhinitis: Secondary | ICD-10-CM | POA: Diagnosis not present

## 2017-02-04 DIAGNOSIS — R921 Mammographic calcification found on diagnostic imaging of breast: Secondary | ICD-10-CM | POA: Diagnosis not present

## 2017-02-04 DIAGNOSIS — J301 Allergic rhinitis due to pollen: Secondary | ICD-10-CM | POA: Diagnosis not present

## 2017-02-04 DIAGNOSIS — R928 Other abnormal and inconclusive findings on diagnostic imaging of breast: Secondary | ICD-10-CM | POA: Diagnosis not present

## 2017-02-04 DIAGNOSIS — J3081 Allergic rhinitis due to animal (cat) (dog) hair and dander: Secondary | ICD-10-CM | POA: Diagnosis not present

## 2017-02-04 DIAGNOSIS — J3089 Other allergic rhinitis: Secondary | ICD-10-CM | POA: Diagnosis not present

## 2017-02-04 DIAGNOSIS — N6489 Other specified disorders of breast: Secondary | ICD-10-CM | POA: Diagnosis not present

## 2017-02-08 DIAGNOSIS — R928 Other abnormal and inconclusive findings on diagnostic imaging of breast: Secondary | ICD-10-CM | POA: Diagnosis not present

## 2017-02-08 DIAGNOSIS — R920 Mammographic microcalcification found on diagnostic imaging of breast: Secondary | ICD-10-CM | POA: Diagnosis not present

## 2017-02-08 DIAGNOSIS — D241 Benign neoplasm of right breast: Secondary | ICD-10-CM | POA: Diagnosis not present

## 2017-03-08 DIAGNOSIS — J3081 Allergic rhinitis due to animal (cat) (dog) hair and dander: Secondary | ICD-10-CM | POA: Diagnosis not present

## 2017-03-08 DIAGNOSIS — J301 Allergic rhinitis due to pollen: Secondary | ICD-10-CM | POA: Diagnosis not present

## 2017-03-08 DIAGNOSIS — J3089 Other allergic rhinitis: Secondary | ICD-10-CM | POA: Diagnosis not present

## 2017-03-10 DIAGNOSIS — G4733 Obstructive sleep apnea (adult) (pediatric): Secondary | ICD-10-CM | POA: Diagnosis not present

## 2017-04-05 DIAGNOSIS — J3089 Other allergic rhinitis: Secondary | ICD-10-CM | POA: Diagnosis not present

## 2017-04-05 DIAGNOSIS — J301 Allergic rhinitis due to pollen: Secondary | ICD-10-CM | POA: Diagnosis not present

## 2017-04-05 DIAGNOSIS — J3081 Allergic rhinitis due to animal (cat) (dog) hair and dander: Secondary | ICD-10-CM | POA: Diagnosis not present

## 2017-04-15 DIAGNOSIS — J3081 Allergic rhinitis due to animal (cat) (dog) hair and dander: Secondary | ICD-10-CM | POA: Diagnosis not present

## 2017-04-15 DIAGNOSIS — J301 Allergic rhinitis due to pollen: Secondary | ICD-10-CM | POA: Diagnosis not present

## 2017-04-15 DIAGNOSIS — J3089 Other allergic rhinitis: Secondary | ICD-10-CM | POA: Diagnosis not present

## 2017-04-15 DIAGNOSIS — G4730 Sleep apnea, unspecified: Secondary | ICD-10-CM | POA: Diagnosis not present

## 2017-04-15 DIAGNOSIS — J4520 Mild intermittent asthma, uncomplicated: Secondary | ICD-10-CM | POA: Diagnosis not present

## 2017-04-15 DIAGNOSIS — I10 Essential (primary) hypertension: Secondary | ICD-10-CM | POA: Diagnosis not present

## 2017-04-23 DIAGNOSIS — J4520 Mild intermittent asthma, uncomplicated: Secondary | ICD-10-CM | POA: Diagnosis not present

## 2017-04-23 DIAGNOSIS — E785 Hyperlipidemia, unspecified: Secondary | ICD-10-CM | POA: Diagnosis not present

## 2017-04-23 DIAGNOSIS — Z23 Encounter for immunization: Secondary | ICD-10-CM | POA: Diagnosis not present

## 2017-04-23 DIAGNOSIS — I10 Essential (primary) hypertension: Secondary | ICD-10-CM | POA: Diagnosis not present

## 2017-04-23 DIAGNOSIS — M150 Primary generalized (osteo)arthritis: Secondary | ICD-10-CM | POA: Diagnosis not present

## 2017-05-03 DIAGNOSIS — J3081 Allergic rhinitis due to animal (cat) (dog) hair and dander: Secondary | ICD-10-CM | POA: Diagnosis not present

## 2017-05-03 DIAGNOSIS — J3089 Other allergic rhinitis: Secondary | ICD-10-CM | POA: Diagnosis not present

## 2017-05-03 DIAGNOSIS — J301 Allergic rhinitis due to pollen: Secondary | ICD-10-CM | POA: Diagnosis not present

## 2017-05-10 DIAGNOSIS — N951 Menopausal and female climacteric states: Secondary | ICD-10-CM | POA: Diagnosis not present

## 2017-06-03 DIAGNOSIS — J3081 Allergic rhinitis due to animal (cat) (dog) hair and dander: Secondary | ICD-10-CM | POA: Diagnosis not present

## 2017-06-03 DIAGNOSIS — J3089 Other allergic rhinitis: Secondary | ICD-10-CM | POA: Diagnosis not present

## 2017-06-03 DIAGNOSIS — J301 Allergic rhinitis due to pollen: Secondary | ICD-10-CM | POA: Diagnosis not present

## 2017-06-29 DIAGNOSIS — J3089 Other allergic rhinitis: Secondary | ICD-10-CM | POA: Diagnosis not present

## 2017-06-29 DIAGNOSIS — J3081 Allergic rhinitis due to animal (cat) (dog) hair and dander: Secondary | ICD-10-CM | POA: Diagnosis not present

## 2017-06-29 DIAGNOSIS — J301 Allergic rhinitis due to pollen: Secondary | ICD-10-CM | POA: Diagnosis not present

## 2017-07-28 DIAGNOSIS — J301 Allergic rhinitis due to pollen: Secondary | ICD-10-CM | POA: Diagnosis not present

## 2017-07-28 DIAGNOSIS — J3081 Allergic rhinitis due to animal (cat) (dog) hair and dander: Secondary | ICD-10-CM | POA: Diagnosis not present

## 2017-07-28 DIAGNOSIS — J3089 Other allergic rhinitis: Secondary | ICD-10-CM | POA: Diagnosis not present

## 2017-08-23 DIAGNOSIS — J3081 Allergic rhinitis due to animal (cat) (dog) hair and dander: Secondary | ICD-10-CM | POA: Diagnosis not present

## 2017-08-23 DIAGNOSIS — J3089 Other allergic rhinitis: Secondary | ICD-10-CM | POA: Diagnosis not present

## 2017-08-23 DIAGNOSIS — J301 Allergic rhinitis due to pollen: Secondary | ICD-10-CM | POA: Diagnosis not present

## 2017-09-16 DIAGNOSIS — J3089 Other allergic rhinitis: Secondary | ICD-10-CM | POA: Diagnosis not present

## 2017-09-16 DIAGNOSIS — J301 Allergic rhinitis due to pollen: Secondary | ICD-10-CM | POA: Diagnosis not present

## 2017-09-16 DIAGNOSIS — J3081 Allergic rhinitis due to animal (cat) (dog) hair and dander: Secondary | ICD-10-CM | POA: Diagnosis not present

## 2017-10-14 DIAGNOSIS — J3089 Other allergic rhinitis: Secondary | ICD-10-CM | POA: Diagnosis not present

## 2017-10-14 DIAGNOSIS — J3081 Allergic rhinitis due to animal (cat) (dog) hair and dander: Secondary | ICD-10-CM | POA: Diagnosis not present

## 2017-10-14 DIAGNOSIS — J301 Allergic rhinitis due to pollen: Secondary | ICD-10-CM | POA: Diagnosis not present

## 2017-10-18 DIAGNOSIS — J3081 Allergic rhinitis due to animal (cat) (dog) hair and dander: Secondary | ICD-10-CM | POA: Diagnosis not present

## 2017-10-18 DIAGNOSIS — J301 Allergic rhinitis due to pollen: Secondary | ICD-10-CM | POA: Diagnosis not present

## 2017-10-20 DIAGNOSIS — G4733 Obstructive sleep apnea (adult) (pediatric): Secondary | ICD-10-CM | POA: Diagnosis not present

## 2017-10-26 DIAGNOSIS — R5383 Other fatigue: Secondary | ICD-10-CM | POA: Diagnosis not present

## 2017-10-26 DIAGNOSIS — R739 Hyperglycemia, unspecified: Secondary | ICD-10-CM | POA: Diagnosis not present

## 2017-10-26 DIAGNOSIS — I10 Essential (primary) hypertension: Secondary | ICD-10-CM | POA: Diagnosis not present

## 2017-10-26 DIAGNOSIS — J4520 Mild intermittent asthma, uncomplicated: Secondary | ICD-10-CM | POA: Diagnosis not present

## 2017-10-26 DIAGNOSIS — E785 Hyperlipidemia, unspecified: Secondary | ICD-10-CM | POA: Diagnosis not present

## 2017-10-26 DIAGNOSIS — M2550 Pain in unspecified joint: Secondary | ICD-10-CM | POA: Diagnosis not present

## 2017-10-26 DIAGNOSIS — Z0000 Encounter for general adult medical examination without abnormal findings: Secondary | ICD-10-CM | POA: Diagnosis not present

## 2017-11-09 DIAGNOSIS — R739 Hyperglycemia, unspecified: Secondary | ICD-10-CM | POA: Diagnosis not present

## 2017-11-10 DIAGNOSIS — J3081 Allergic rhinitis due to animal (cat) (dog) hair and dander: Secondary | ICD-10-CM | POA: Diagnosis not present

## 2017-11-10 DIAGNOSIS — J301 Allergic rhinitis due to pollen: Secondary | ICD-10-CM | POA: Diagnosis not present

## 2017-11-10 DIAGNOSIS — J3089 Other allergic rhinitis: Secondary | ICD-10-CM | POA: Diagnosis not present

## 2017-11-25 DIAGNOSIS — I10 Essential (primary) hypertension: Secondary | ICD-10-CM | POA: Diagnosis not present

## 2017-11-25 DIAGNOSIS — E119 Type 2 diabetes mellitus without complications: Secondary | ICD-10-CM | POA: Diagnosis not present

## 2017-11-25 DIAGNOSIS — E785 Hyperlipidemia, unspecified: Secondary | ICD-10-CM | POA: Diagnosis not present

## 2017-12-09 DIAGNOSIS — J3089 Other allergic rhinitis: Secondary | ICD-10-CM | POA: Diagnosis not present

## 2017-12-09 DIAGNOSIS — J3081 Allergic rhinitis due to animal (cat) (dog) hair and dander: Secondary | ICD-10-CM | POA: Diagnosis not present

## 2017-12-09 DIAGNOSIS — J301 Allergic rhinitis due to pollen: Secondary | ICD-10-CM | POA: Diagnosis not present

## 2017-12-14 DIAGNOSIS — E119 Type 2 diabetes mellitus without complications: Secondary | ICD-10-CM | POA: Diagnosis not present

## 2017-12-14 DIAGNOSIS — Z713 Dietary counseling and surveillance: Secondary | ICD-10-CM | POA: Diagnosis not present

## 2017-12-16 DIAGNOSIS — J449 Chronic obstructive pulmonary disease, unspecified: Secondary | ICD-10-CM | POA: Diagnosis not present

## 2017-12-16 DIAGNOSIS — G4733 Obstructive sleep apnea (adult) (pediatric): Secondary | ICD-10-CM | POA: Diagnosis not present

## 2017-12-27 DIAGNOSIS — M19041 Primary osteoarthritis, right hand: Secondary | ICD-10-CM | POA: Diagnosis not present

## 2017-12-27 DIAGNOSIS — M1612 Unilateral primary osteoarthritis, left hip: Secondary | ICD-10-CM | POA: Diagnosis not present

## 2017-12-27 DIAGNOSIS — M79642 Pain in left hand: Secondary | ICD-10-CM | POA: Diagnosis not present

## 2017-12-27 DIAGNOSIS — Z79899 Other long term (current) drug therapy: Secondary | ICD-10-CM | POA: Diagnosis not present

## 2017-12-27 DIAGNOSIS — M19042 Primary osteoarthritis, left hand: Secondary | ICD-10-CM | POA: Diagnosis not present

## 2017-12-27 DIAGNOSIS — M150 Primary generalized (osteo)arthritis: Secondary | ICD-10-CM | POA: Diagnosis not present

## 2017-12-27 DIAGNOSIS — M25552 Pain in left hip: Secondary | ICD-10-CM | POA: Diagnosis not present

## 2017-12-27 DIAGNOSIS — M19031 Primary osteoarthritis, right wrist: Secondary | ICD-10-CM | POA: Diagnosis not present

## 2017-12-27 DIAGNOSIS — M79641 Pain in right hand: Secondary | ICD-10-CM | POA: Diagnosis not present

## 2017-12-27 DIAGNOSIS — M0579 Rheumatoid arthritis with rheumatoid factor of multiple sites without organ or systems involvement: Secondary | ICD-10-CM | POA: Diagnosis not present

## 2018-01-06 DIAGNOSIS — J3089 Other allergic rhinitis: Secondary | ICD-10-CM | POA: Diagnosis not present

## 2018-01-06 DIAGNOSIS — J3081 Allergic rhinitis due to animal (cat) (dog) hair and dander: Secondary | ICD-10-CM | POA: Diagnosis not present

## 2018-01-06 DIAGNOSIS — J301 Allergic rhinitis due to pollen: Secondary | ICD-10-CM | POA: Diagnosis not present

## 2018-01-24 DIAGNOSIS — M0579 Rheumatoid arthritis with rheumatoid factor of multiple sites without organ or systems involvement: Secondary | ICD-10-CM | POA: Diagnosis not present

## 2018-01-24 DIAGNOSIS — Z79899 Other long term (current) drug therapy: Secondary | ICD-10-CM | POA: Diagnosis not present

## 2018-01-31 DIAGNOSIS — J301 Allergic rhinitis due to pollen: Secondary | ICD-10-CM | POA: Diagnosis not present

## 2018-01-31 DIAGNOSIS — J3081 Allergic rhinitis due to animal (cat) (dog) hair and dander: Secondary | ICD-10-CM | POA: Diagnosis not present

## 2018-01-31 DIAGNOSIS — J3089 Other allergic rhinitis: Secondary | ICD-10-CM | POA: Diagnosis not present

## 2018-02-07 DIAGNOSIS — J3089 Other allergic rhinitis: Secondary | ICD-10-CM | POA: Diagnosis not present

## 2018-02-11 DIAGNOSIS — Z1231 Encounter for screening mammogram for malignant neoplasm of breast: Secondary | ICD-10-CM | POA: Diagnosis not present

## 2018-02-21 DIAGNOSIS — E785 Hyperlipidemia, unspecified: Secondary | ICD-10-CM | POA: Diagnosis not present

## 2018-02-21 DIAGNOSIS — E1151 Type 2 diabetes mellitus with diabetic peripheral angiopathy without gangrene: Secondary | ICD-10-CM | POA: Diagnosis not present

## 2018-03-07 DIAGNOSIS — J301 Allergic rhinitis due to pollen: Secondary | ICD-10-CM | POA: Diagnosis not present

## 2018-03-07 DIAGNOSIS — J3081 Allergic rhinitis due to animal (cat) (dog) hair and dander: Secondary | ICD-10-CM | POA: Diagnosis not present

## 2018-03-07 DIAGNOSIS — J3089 Other allergic rhinitis: Secondary | ICD-10-CM | POA: Diagnosis not present

## 2018-03-15 DIAGNOSIS — M069 Rheumatoid arthritis, unspecified: Secondary | ICD-10-CM | POA: Diagnosis not present

## 2018-03-15 DIAGNOSIS — E1152 Type 2 diabetes mellitus with diabetic peripheral angiopathy with gangrene: Secondary | ICD-10-CM | POA: Diagnosis not present

## 2018-03-15 DIAGNOSIS — E1140 Type 2 diabetes mellitus with diabetic neuropathy, unspecified: Secondary | ICD-10-CM | POA: Diagnosis not present

## 2018-03-28 DIAGNOSIS — M150 Primary generalized (osteo)arthritis: Secondary | ICD-10-CM | POA: Diagnosis not present

## 2018-03-28 DIAGNOSIS — M0579 Rheumatoid arthritis with rheumatoid factor of multiple sites without organ or systems involvement: Secondary | ICD-10-CM | POA: Diagnosis not present

## 2018-03-28 DIAGNOSIS — Z79899 Other long term (current) drug therapy: Secondary | ICD-10-CM | POA: Diagnosis not present

## 2018-04-06 DIAGNOSIS — J301 Allergic rhinitis due to pollen: Secondary | ICD-10-CM | POA: Diagnosis not present

## 2018-04-06 DIAGNOSIS — J3081 Allergic rhinitis due to animal (cat) (dog) hair and dander: Secondary | ICD-10-CM | POA: Diagnosis not present

## 2018-04-06 DIAGNOSIS — J3089 Other allergic rhinitis: Secondary | ICD-10-CM | POA: Diagnosis not present

## 2018-04-21 DIAGNOSIS — J3081 Allergic rhinitis due to animal (cat) (dog) hair and dander: Secondary | ICD-10-CM | POA: Diagnosis not present

## 2018-04-21 DIAGNOSIS — I10 Essential (primary) hypertension: Secondary | ICD-10-CM | POA: Diagnosis not present

## 2018-04-21 DIAGNOSIS — J3089 Other allergic rhinitis: Secondary | ICD-10-CM | POA: Diagnosis not present

## 2018-04-21 DIAGNOSIS — G4730 Sleep apnea, unspecified: Secondary | ICD-10-CM | POA: Diagnosis not present

## 2018-04-21 DIAGNOSIS — J4520 Mild intermittent asthma, uncomplicated: Secondary | ICD-10-CM | POA: Diagnosis not present

## 2018-04-21 DIAGNOSIS — J301 Allergic rhinitis due to pollen: Secondary | ICD-10-CM | POA: Diagnosis not present

## 2018-05-05 DIAGNOSIS — J3081 Allergic rhinitis due to animal (cat) (dog) hair and dander: Secondary | ICD-10-CM | POA: Diagnosis not present

## 2018-05-05 DIAGNOSIS — J301 Allergic rhinitis due to pollen: Secondary | ICD-10-CM | POA: Diagnosis not present

## 2018-05-05 DIAGNOSIS — J3089 Other allergic rhinitis: Secondary | ICD-10-CM | POA: Diagnosis not present

## 2018-05-19 DIAGNOSIS — G4733 Obstructive sleep apnea (adult) (pediatric): Secondary | ICD-10-CM | POA: Diagnosis not present

## 2018-05-20 DIAGNOSIS — Z23 Encounter for immunization: Secondary | ICD-10-CM | POA: Diagnosis not present

## 2018-06-02 DIAGNOSIS — J3089 Other allergic rhinitis: Secondary | ICD-10-CM | POA: Diagnosis not present

## 2018-06-02 DIAGNOSIS — J3081 Allergic rhinitis due to animal (cat) (dog) hair and dander: Secondary | ICD-10-CM | POA: Diagnosis not present

## 2018-06-02 DIAGNOSIS — J301 Allergic rhinitis due to pollen: Secondary | ICD-10-CM | POA: Diagnosis not present

## 2018-06-28 DIAGNOSIS — E1151 Type 2 diabetes mellitus with diabetic peripheral angiopathy without gangrene: Secondary | ICD-10-CM | POA: Diagnosis not present

## 2018-06-28 DIAGNOSIS — I10 Essential (primary) hypertension: Secondary | ICD-10-CM | POA: Diagnosis not present

## 2018-06-28 DIAGNOSIS — J4520 Mild intermittent asthma, uncomplicated: Secondary | ICD-10-CM | POA: Diagnosis not present

## 2018-06-28 DIAGNOSIS — E785 Hyperlipidemia, unspecified: Secondary | ICD-10-CM | POA: Diagnosis not present

## 2018-06-29 DIAGNOSIS — M0579 Rheumatoid arthritis with rheumatoid factor of multiple sites without organ or systems involvement: Secondary | ICD-10-CM | POA: Diagnosis not present

## 2018-06-29 DIAGNOSIS — Z79899 Other long term (current) drug therapy: Secondary | ICD-10-CM | POA: Diagnosis not present

## 2018-06-30 DIAGNOSIS — J301 Allergic rhinitis due to pollen: Secondary | ICD-10-CM | POA: Diagnosis not present

## 2018-06-30 DIAGNOSIS — J3089 Other allergic rhinitis: Secondary | ICD-10-CM | POA: Diagnosis not present

## 2018-06-30 DIAGNOSIS — J3081 Allergic rhinitis due to animal (cat) (dog) hair and dander: Secondary | ICD-10-CM | POA: Diagnosis not present

## 2018-07-27 DIAGNOSIS — J301 Allergic rhinitis due to pollen: Secondary | ICD-10-CM | POA: Diagnosis not present

## 2018-07-27 DIAGNOSIS — J3089 Other allergic rhinitis: Secondary | ICD-10-CM | POA: Diagnosis not present

## 2018-07-27 DIAGNOSIS — J3081 Allergic rhinitis due to animal (cat) (dog) hair and dander: Secondary | ICD-10-CM | POA: Diagnosis not present

## 2018-08-31 DIAGNOSIS — J3081 Allergic rhinitis due to animal (cat) (dog) hair and dander: Secondary | ICD-10-CM | POA: Diagnosis not present

## 2018-08-31 DIAGNOSIS — J301 Allergic rhinitis due to pollen: Secondary | ICD-10-CM | POA: Diagnosis not present

## 2018-08-31 DIAGNOSIS — J3089 Other allergic rhinitis: Secondary | ICD-10-CM | POA: Diagnosis not present

## 2018-09-08 DIAGNOSIS — J301 Allergic rhinitis due to pollen: Secondary | ICD-10-CM | POA: Diagnosis not present

## 2018-09-08 DIAGNOSIS — J3081 Allergic rhinitis due to animal (cat) (dog) hair and dander: Secondary | ICD-10-CM | POA: Diagnosis not present

## 2018-09-27 DIAGNOSIS — J301 Allergic rhinitis due to pollen: Secondary | ICD-10-CM | POA: Diagnosis not present

## 2018-09-27 DIAGNOSIS — J3089 Other allergic rhinitis: Secondary | ICD-10-CM | POA: Diagnosis not present

## 2018-09-27 DIAGNOSIS — J3081 Allergic rhinitis due to animal (cat) (dog) hair and dander: Secondary | ICD-10-CM | POA: Diagnosis not present

## 2018-09-28 DIAGNOSIS — Z79899 Other long term (current) drug therapy: Secondary | ICD-10-CM | POA: Diagnosis not present

## 2018-09-28 DIAGNOSIS — M7062 Trochanteric bursitis, left hip: Secondary | ICD-10-CM | POA: Diagnosis not present

## 2018-09-28 DIAGNOSIS — M150 Primary generalized (osteo)arthritis: Secondary | ICD-10-CM | POA: Diagnosis not present

## 2018-09-28 DIAGNOSIS — G4733 Obstructive sleep apnea (adult) (pediatric): Secondary | ICD-10-CM | POA: Diagnosis not present

## 2018-09-28 DIAGNOSIS — M0579 Rheumatoid arthritis with rheumatoid factor of multiple sites without organ or systems involvement: Secondary | ICD-10-CM | POA: Diagnosis not present

## 2018-10-17 DIAGNOSIS — I10 Essential (primary) hypertension: Secondary | ICD-10-CM | POA: Diagnosis not present

## 2018-10-17 DIAGNOSIS — Z0000 Encounter for general adult medical examination without abnormal findings: Secondary | ICD-10-CM | POA: Diagnosis not present

## 2018-10-17 DIAGNOSIS — J4520 Mild intermittent asthma, uncomplicated: Secondary | ICD-10-CM | POA: Diagnosis not present

## 2018-10-17 DIAGNOSIS — E1169 Type 2 diabetes mellitus with other specified complication: Secondary | ICD-10-CM | POA: Diagnosis not present

## 2018-10-17 DIAGNOSIS — R5383 Other fatigue: Secondary | ICD-10-CM | POA: Diagnosis not present

## 2018-10-17 DIAGNOSIS — E1151 Type 2 diabetes mellitus with diabetic peripheral angiopathy without gangrene: Secondary | ICD-10-CM | POA: Diagnosis not present

## 2018-10-17 DIAGNOSIS — E785 Hyperlipidemia, unspecified: Secondary | ICD-10-CM | POA: Diagnosis not present

## 2018-10-17 DIAGNOSIS — Z23 Encounter for immunization: Secondary | ICD-10-CM | POA: Diagnosis not present

## 2018-11-01 DIAGNOSIS — J3089 Other allergic rhinitis: Secondary | ICD-10-CM | POA: Diagnosis not present

## 2018-11-01 DIAGNOSIS — J3081 Allergic rhinitis due to animal (cat) (dog) hair and dander: Secondary | ICD-10-CM | POA: Diagnosis not present

## 2018-11-01 DIAGNOSIS — J301 Allergic rhinitis due to pollen: Secondary | ICD-10-CM | POA: Diagnosis not present

## 2018-12-01 DIAGNOSIS — J3089 Other allergic rhinitis: Secondary | ICD-10-CM | POA: Diagnosis not present

## 2018-12-01 DIAGNOSIS — J301 Allergic rhinitis due to pollen: Secondary | ICD-10-CM | POA: Diagnosis not present

## 2018-12-21 DIAGNOSIS — G4733 Obstructive sleep apnea (adult) (pediatric): Secondary | ICD-10-CM | POA: Diagnosis not present

## 2018-12-21 DIAGNOSIS — J449 Chronic obstructive pulmonary disease, unspecified: Secondary | ICD-10-CM | POA: Diagnosis not present

## 2018-12-29 DIAGNOSIS — J3089 Other allergic rhinitis: Secondary | ICD-10-CM | POA: Diagnosis not present

## 2018-12-29 DIAGNOSIS — J301 Allergic rhinitis due to pollen: Secondary | ICD-10-CM | POA: Diagnosis not present

## 2018-12-29 DIAGNOSIS — J3081 Allergic rhinitis due to animal (cat) (dog) hair and dander: Secondary | ICD-10-CM | POA: Diagnosis not present

## 2018-12-30 DIAGNOSIS — Z79899 Other long term (current) drug therapy: Secondary | ICD-10-CM | POA: Diagnosis not present

## 2018-12-30 DIAGNOSIS — G4733 Obstructive sleep apnea (adult) (pediatric): Secondary | ICD-10-CM | POA: Diagnosis not present

## 2018-12-30 DIAGNOSIS — M0579 Rheumatoid arthritis with rheumatoid factor of multiple sites without organ or systems involvement: Secondary | ICD-10-CM | POA: Diagnosis not present

## 2019-01-26 DIAGNOSIS — J3081 Allergic rhinitis due to animal (cat) (dog) hair and dander: Secondary | ICD-10-CM | POA: Diagnosis not present

## 2019-01-26 DIAGNOSIS — J301 Allergic rhinitis due to pollen: Secondary | ICD-10-CM | POA: Diagnosis not present

## 2019-01-26 DIAGNOSIS — G4733 Obstructive sleep apnea (adult) (pediatric): Secondary | ICD-10-CM | POA: Diagnosis not present

## 2019-01-26 DIAGNOSIS — J3089 Other allergic rhinitis: Secondary | ICD-10-CM | POA: Diagnosis not present

## 2019-02-03 DIAGNOSIS — J3089 Other allergic rhinitis: Secondary | ICD-10-CM | POA: Diagnosis not present

## 2019-02-13 DIAGNOSIS — Z1231 Encounter for screening mammogram for malignant neoplasm of breast: Secondary | ICD-10-CM | POA: Diagnosis not present

## 2019-02-14 DIAGNOSIS — G4733 Obstructive sleep apnea (adult) (pediatric): Secondary | ICD-10-CM | POA: Diagnosis not present

## 2019-02-21 DIAGNOSIS — Z13820 Encounter for screening for osteoporosis: Secondary | ICD-10-CM | POA: Diagnosis not present

## 2019-02-23 DIAGNOSIS — J301 Allergic rhinitis due to pollen: Secondary | ICD-10-CM | POA: Diagnosis not present

## 2019-02-23 DIAGNOSIS — J3081 Allergic rhinitis due to animal (cat) (dog) hair and dander: Secondary | ICD-10-CM | POA: Diagnosis not present

## 2019-02-23 DIAGNOSIS — J3089 Other allergic rhinitis: Secondary | ICD-10-CM | POA: Diagnosis not present

## 2019-03-06 DIAGNOSIS — M0579 Rheumatoid arthritis with rheumatoid factor of multiple sites without organ or systems involvement: Secondary | ICD-10-CM | POA: Diagnosis not present

## 2019-03-06 DIAGNOSIS — Z79899 Other long term (current) drug therapy: Secondary | ICD-10-CM | POA: Diagnosis not present

## 2019-03-10 DIAGNOSIS — M810 Age-related osteoporosis without current pathological fracture: Secondary | ICD-10-CM | POA: Diagnosis not present

## 2019-03-10 DIAGNOSIS — E1169 Type 2 diabetes mellitus with other specified complication: Secondary | ICD-10-CM | POA: Diagnosis not present

## 2019-03-10 DIAGNOSIS — Z79899 Other long term (current) drug therapy: Secondary | ICD-10-CM | POA: Diagnosis not present

## 2019-03-10 DIAGNOSIS — I10 Essential (primary) hypertension: Secondary | ICD-10-CM | POA: Diagnosis not present

## 2019-03-10 DIAGNOSIS — E785 Hyperlipidemia, unspecified: Secondary | ICD-10-CM | POA: Diagnosis not present

## 2019-03-28 DIAGNOSIS — J3081 Allergic rhinitis due to animal (cat) (dog) hair and dander: Secondary | ICD-10-CM | POA: Diagnosis not present

## 2019-03-28 DIAGNOSIS — J3089 Other allergic rhinitis: Secondary | ICD-10-CM | POA: Diagnosis not present

## 2019-03-28 DIAGNOSIS — J301 Allergic rhinitis due to pollen: Secondary | ICD-10-CM | POA: Diagnosis not present

## 2019-04-03 DIAGNOSIS — R7989 Other specified abnormal findings of blood chemistry: Secondary | ICD-10-CM | POA: Diagnosis not present

## 2019-04-25 DIAGNOSIS — J3081 Allergic rhinitis due to animal (cat) (dog) hair and dander: Secondary | ICD-10-CM | POA: Diagnosis not present

## 2019-04-25 DIAGNOSIS — J3089 Other allergic rhinitis: Secondary | ICD-10-CM | POA: Diagnosis not present

## 2019-04-25 DIAGNOSIS — J301 Allergic rhinitis due to pollen: Secondary | ICD-10-CM | POA: Diagnosis not present

## 2019-04-27 DIAGNOSIS — J301 Allergic rhinitis due to pollen: Secondary | ICD-10-CM | POA: Diagnosis not present

## 2019-04-27 DIAGNOSIS — J3089 Other allergic rhinitis: Secondary | ICD-10-CM | POA: Diagnosis not present

## 2019-04-27 DIAGNOSIS — M069 Rheumatoid arthritis, unspecified: Secondary | ICD-10-CM | POA: Diagnosis not present

## 2019-04-27 DIAGNOSIS — J3081 Allergic rhinitis due to animal (cat) (dog) hair and dander: Secondary | ICD-10-CM | POA: Diagnosis not present

## 2019-04-27 DIAGNOSIS — G4730 Sleep apnea, unspecified: Secondary | ICD-10-CM | POA: Diagnosis not present

## 2019-04-27 DIAGNOSIS — J4520 Mild intermittent asthma, uncomplicated: Secondary | ICD-10-CM | POA: Diagnosis not present

## 2019-05-03 DIAGNOSIS — H11421 Conjunctival edema, right eye: Secondary | ICD-10-CM | POA: Diagnosis not present

## 2019-05-03 DIAGNOSIS — S00201A Unspecified superficial injury of right eyelid and periocular area, initial encounter: Secondary | ICD-10-CM | POA: Diagnosis not present

## 2019-05-04 DIAGNOSIS — Z86010 Personal history of colonic polyps: Secondary | ICD-10-CM | POA: Diagnosis not present

## 2019-05-04 DIAGNOSIS — K644 Residual hemorrhoidal skin tags: Secondary | ICD-10-CM | POA: Diagnosis not present

## 2019-05-04 DIAGNOSIS — Z1211 Encounter for screening for malignant neoplasm of colon: Secondary | ICD-10-CM | POA: Diagnosis not present
